# Patient Record
Sex: Female | Born: 2005 | Race: White | Hispanic: No | Marital: Single | Smoking: Never smoker
Health system: Southern US, Community
[De-identification: ages and names within clinical notes are randomized; demographics above are authoritative.]

## PROBLEM LIST (undated history)

## (undated) DIAGNOSIS — Z91018 Allergy to other foods: Secondary | ICD-10-CM

## (undated) HISTORY — DX: Allergy to other foods: Z91.018

## (undated) HISTORY — PX: NO PAST SURGERIES: SHX2092

---
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
## 2023-06-06 ENCOUNTER — Ambulatory Visit: Payer: BC Managed Care – PPO | Admitting: Internal Medicine

## 2024-01-28 ENCOUNTER — Emergency Department (HOSPITAL_BASED_OUTPATIENT_CLINIC_OR_DEPARTMENT_OTHER)

## 2024-01-28 ENCOUNTER — Encounter (HOSPITAL_BASED_OUTPATIENT_CLINIC_OR_DEPARTMENT_OTHER): Payer: Self-pay | Admitting: Emergency Medicine

## 2024-01-28 ENCOUNTER — Emergency Department (HOSPITAL_BASED_OUTPATIENT_CLINIC_OR_DEPARTMENT_OTHER)
Admission: EM | Admit: 2024-01-28 | Discharge: 2024-01-28 | Disposition: A | Discharge status: Home / Self Care | Attending: Emergency Medicine | Admitting: Emergency Medicine

## 2024-01-28 ENCOUNTER — Other Ambulatory Visit: Payer: Self-pay

## 2024-01-28 DIAGNOSIS — R1032 Left lower quadrant pain: Secondary | ICD-10-CM | POA: Insufficient documentation

## 2024-01-28 DIAGNOSIS — R110 Nausea: Secondary | ICD-10-CM | POA: Diagnosis not present

## 2024-01-28 DIAGNOSIS — R109 Unspecified abdominal pain: Secondary | ICD-10-CM | POA: Diagnosis present

## 2024-01-28 LAB — URINALYSIS, ROUTINE W REFLEX MICROSCOPIC
Glucose, UA: NEGATIVE mg/dL
Hgb urine dipstick: NEGATIVE
Ketones, ur: 15 mg/dL — AB
Leukocytes,Ua: NEGATIVE
Nitrite: NEGATIVE
Protein, ur: NEGATIVE mg/dL
Specific Gravity, Urine: >=1.03 (ref 1.005–1.030)
pH: 6 (ref 5.0–8.0)

## 2024-01-28 LAB — COMPREHENSIVE METABOLIC PANEL WITH GFR
ALT: 13 U/L (ref 0–44)
AST: 18 U/L (ref 15–41)
Albumin: 3.6 g/dL (ref 3.5–5.0)
Alkaline Phosphatase: 36 U/L — ABNORMAL LOW (ref 38–126)
Anion gap: 8 (ref 5–15)
BUN: 10 mg/dL (ref 6–20)
CO2: 22 mmol/L (ref 22–32)
Calcium: 8.7 mg/dL — ABNORMAL LOW (ref 8.9–10.3)
Chloride: 105 mmol/L (ref 98–111)
Creatinine, Ser: 0.91 mg/dL (ref 0.44–1.00)
GFR, Estimated: >60 mL/min (ref >60)
Glucose, Bld: 100 mg/dL — ABNORMAL HIGH (ref 70–99)
Potassium: 3.3 mmol/L — ABNORMAL LOW (ref 3.5–5.1)
Sodium: 135 mmol/L (ref 135–145)
Total Bilirubin: 0.5 mg/dL (ref 0.0–1.2)
Total Protein: 6.9 g/dL (ref 6.5–8.1)

## 2024-01-28 LAB — PREGNANCY, URINE: Preg Test, Ur: NEGATIVE

## 2024-01-28 LAB — CBC
HCT: 39.5 % (ref 36.0–46.0)
Hemoglobin: 13.6 g/dL (ref 12.0–15.0)
MCH: 30 pg (ref 26.0–34.0)
MCHC: 34.4 g/dL (ref 30.0–36.0)
MCV: 87 fL (ref 80.0–100.0)
Platelets: 297 10*3/uL (ref 150–400)
RBC: 4.54 MIL/uL (ref 3.87–5.11)
RDW: 12.7 % (ref 11.5–15.5)
WBC: 9.5 10*3/uL (ref 4.0–10.5)
nRBC: 0 % (ref 0.0–0.2)

## 2024-01-28 MED ORDER — MORPHINE SULFATE (PF) 4 MG/ML IV SOLN
4.0000 mg | Freq: Once | INTRAVENOUS | Status: AC
  Administered 2024-01-28: 4 mg via INTRAVENOUS
  Filled 2024-01-28: qty 1

## 2024-01-28 MED ORDER — ONDANSETRON HCL 4 MG/2ML IJ SOLN
4.0000 mg | Freq: Once | INTRAMUSCULAR | Status: AC
  Administered 2024-01-28: 4 mg via INTRAVENOUS
  Filled 2024-01-28: qty 2

## 2024-01-28 MED ORDER — KETOROLAC TROMETHAMINE 15 MG/ML IJ SOLN
15.0000 mg | Freq: Once | INTRAMUSCULAR | Status: AC
  Administered 2024-01-28: 15 mg via INTRAVENOUS
  Filled 2024-01-28: qty 1

## 2024-01-28 MED ORDER — METHOCARBAMOL 500 MG PO TABS: 500.0000 mg | ORAL_TABLET | Freq: Three times a day (TID) | ORAL | 0 refills | Status: AC | PRN

## 2024-01-28 MED ORDER — SODIUM CHLORIDE 0.9 % IV BOLUS
1000.0000 mL | Freq: Once | INTRAVENOUS | Status: AC
  Administered 2024-01-28: 1000 mL via INTRAVENOUS

## 2024-01-28 MED ORDER — NAPROXEN 500 MG PO TABS: 500.0000 mg | ORAL_TABLET | Freq: Two times a day (BID) | ORAL | 0 refills | Status: AC

## 2024-01-28 MED ORDER — IOHEXOL 300 MG/ML  SOLN
80.0000 mL | Freq: Once | INTRAMUSCULAR | Status: AC | PRN
  Administered 2024-01-28: 80 mL via INTRAVENOUS

## 2024-01-28 NOTE — ED Triage Notes (Signed)
 L flank pain that wraps to LLQ that came on suddenly about 2230 last night. No hx kidney stones, no urinary sx, no vaginal sx. Pt has urinated since onset of pain, denies hematuria. No hx of ovarian cysts. No fever.

## 2024-01-28 NOTE — ED Provider Notes (Signed)
 [facility] Emergency Department Provider Note MRN:  [id number]  Arrival date & time: 01/28/24     Chief Complaint   Flank Pain   History of Present Illness   Carrie Burnett is a 18 y.o. year-old female with no pertinent past medical history presenting to the ED with chief complaint of flank pain.  Left flank pain with radiation to the left lower quadrant starting suddenly while patient was driving at about 40:98 PM.  Nausea but no vomiting, no constipation or diarrhea, no dysuria or hematuria, no vaginal bleeding or discharge.  Review of Systems  A thorough review of systems was obtained and all systems are negative except as noted in the HPI and PMH.   Patient's Health History    Past Medical History:  Diagnosis Date   Tree nut allergy     Past Surgical History:  Procedure Laterality Date   LAPAROSCOPIC APPENDECTOMY N/A 04/22/2021   Procedure: APPENDECTOMY LAPAROSCOPIC;  Surgeon: Luretha Murphy, MD;  Location: [facility] ORS;  Service: General;  Laterality: N/A;   NO PAST SURGERIES      Family History  Problem Relation Age of Onset   Allergic rhinitis Mother    Allergic rhinitis Father    Asthma Cousin    Angioedema Neg Hx    Eczema Neg Hx    Immunodeficiency Neg Hx    Urticaria Neg Hx     Social History   Socioeconomic History   Marital status: Single    Spouse name: Not on file   Number of children: Not on file   Years of education: Not on file   Highest education level: Not on file  Occupational History   Not on file  Tobacco Use   Smoking status: Never   Smokeless tobacco: Never  Vaping Use   Vaping status: Never Used  Substance and Sexual Activity   Alcohol use: Never   Drug use: Never   Sexual activity: Never    Birth control/protection: Pill  Other Topics Concern   Not on file  Social History Narrative   Not on file   Social Drivers of Health   Financial Resource Strain: Not on file  Food Insecurity: Not on file   Transportation Needs: Not on file  Physical Activity: Not on file  Stress: Not on file  Social Connections: Not on file  Intimate Partner Violence: Not on file     Physical Exam   Vitals:   01/28/24 0049 01/28/24 0050  BP: 125/80   Pulse: 87 87  Resp: 20   SpO2:  99%    CONSTITUTIONAL: Well-appearing, NAD NEURO/PSYCH:  Alert and oriented x 3, no focal deficits EYES:  eyes equal and reactive ENT/NECK:  no LAD, no JVD CARDIO: Regular rate, well-perfused, normal S1 and S2 PULM:  CTAB no wheezing or rhonchi GI/GU:  non-distended, non-tender MSK/SPINE:  No gross deformities, no edema SKIN:  no rash, atraumatic   *Additional and/or pertinent findings included in MDM below  Diagnostic and Interventional Summary    EKG Interpretation Date/Time:    Ventricular Rate:    PR Interval:    QRS Duration:    QT Interval:    QTC Calculation:   R Axis:      Text Interpretation:         Labs Reviewed  URINALYSIS, ROUTINE W REFLEX MICROSCOPIC - Abnormal; Notable for the following components:      Result Value   Bilirubin Urine SMALL (*)    Ketones, ur 15 (*)  All other components within normal limits  COMPREHENSIVE METABOLIC PANEL WITH GFR - Abnormal; Notable for the following components:   Potassium 3.3 (*)    Glucose, Bld 100 (*)    Calcium 8.7 (*)    Alkaline Phosphatase 36 (*)    All other components within normal limits  PREGNANCY, URINE  CBC    CT ABDOMEN PELVIS W CONTRAST  Final Result      Medications  ketorolac (TORADOL) 15 MG/ML injection 15 mg (15 mg Intravenous Given 01/28/24 0120)  morphine (PF) 4 MG/ML injection 4 mg (4 mg Intravenous Given 01/28/24 0221)  ondansetron (ZOFRAN) injection 4 mg (4 mg Intravenous Given 01/28/24 0218)  sodium chloride 0.9 % bolus 1,000 mL (1,000 mLs Intravenous New Bag/Given 01/28/24 0218)  iohexol (OMNIPAQUE) 300 MG/ML solution 80 mL (80 mLs Intravenous Contrast Given 01/28/24 0238)     Procedures  /  Critical Care Ultrasound  ED Renal  Date/Time: 01/28/2024 1:34 AM  Performed by: Sabas Sous, MD Authorized by: Sabas Sous, MD   Procedure details:    Indications comment:  Flank pain   Technique:  L kidney and R kidneyImages: archived Left kidney findings:    Hydronephrosis: moderate   Right kidney findings:    Hydronephrosis: none     ED Course and Medical Decision Making  Initial Impression and Ddx Suspect kidney stone, other consideration includes ovarian cyst, overall low concern for torsion.  Past medical/surgical history that increases complexity of ED encounter: None otherwise healthy  Interpretation of Diagnostics I personally reviewed the Laboratory Testing and my interpretation is as follows: No significant blood count or electrolyte disturbance.  CT unremarkable, no hydronephrosis on CT, and so bedside ultrasound findings likely inaccurate.  Patient Reassessment and Ultimate Disposition/Management     Patient feeling much better after medications listed above.  We discussed the consideration of ovarian torsion.  Overall I find it to be very unlikely especially given patient's well-appearing nature and lack of secondary findings on CT.  Patient observed a bit longer to ensure no return of pain and she remains comfortable and in no acute distress and so ultrasound felt to be low yield, necessary at this time.  Appropriate for discharge with return precautions.  Patient management required discussion with the following services or consulting groups:  None  Complexity of Problems Addressed Acute illness or injury that poses threat of life of bodily function  Additional Data Reviewed and Analyzed Further history obtained from: Further history from spouse/family member  Additional Factors Impacting ED Encounter Risk Use of parenteral controlled substances and Consideration of hospitalization  Elmer Sow. Pilar Plate, MD [facility] Emergency Medicine [facility]  Health [email or web address]  Final Clinical Impressions(s) / ED Diagnoses     ICD-10-CM   1. Flank pain  R10.9       ED Discharge Orders          Ordered    naproxen (NAPROSYN) 500 MG tablet  2 times daily        01/28/24 0333    methocarbamol (ROBAXIN) 500 MG tablet  Every 8 hours PRN        01/28/24 0333             Discharge Instructions Discussed with and Provided to Patient:     Discharge Instructions      You were evaluated in the Emergency Department and after careful evaluation, we did not find any emergent condition requiring admission or further testing in the hospital.  Your exam/testing  today is overall reassuring.  Recommend using the Naprosyn twice daily as prescribed for pain.  Can use the Robaxin muscle relaxer for more significant pain, best used at night if you are having trouble sleeping as it can cause drowsiness.  Please return to the Emergency Department if you experience any worsening of your condition.   Thank you for allowing [place] to be a part of your care.       Sabas Sous, MD 01/28/24 [phone]

## 2024-01-28 NOTE — Discharge Instructions (Addendum)
 You were evaluated in the Emergency Department and after careful evaluation, we did not find any emergent condition requiring admission or further testing in the hospital.  Your exam/testing today is overall reassuring.  Recommend using the Naprosyn twice daily as prescribed for pain.  Can use the Robaxin muscle relaxer for more significant pain, best used at night if you are having trouble sleeping as it can cause drowsiness.  Please return to the Emergency Department if you experience any worsening of your condition.   Thank you for allowing [place] to be a part of your care.
